# Patient Record
Sex: Female | Born: 1995 | Race: Black or African American | Hispanic: No | Marital: Single | Smoking: Never smoker
Health system: Southern US, Community
[De-identification: ages and names within clinical notes are randomized; demographics above are authoritative.]

## PROBLEM LIST (undated history)

## (undated) ENCOUNTER — Ambulatory Visit: Admission: EM | Payer: BC Managed Care – PPO | Source: Home / Self Care

## (undated) HISTORY — PX: ADENOIDECTOMY: SUR15

## (undated) HISTORY — PX: TONSILLECTOMY: SUR1361

---
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
## 2016-08-14 ENCOUNTER — Encounter (HOSPITAL_COMMUNITY): Payer: Self-pay

## 2016-08-14 ENCOUNTER — Emergency Department (HOSPITAL_COMMUNITY)
Admission: EM | Admit: 2016-08-14 | Discharge: 2016-08-14 | Disposition: A | Discharge status: Home / Self Care | Payer: BLUE CROSS/BLUE SHIELD | Attending: Emergency Medicine | Admitting: Emergency Medicine

## 2016-08-14 DIAGNOSIS — R21 Rash and other nonspecific skin eruption: Secondary | ICD-10-CM | POA: Diagnosis not present

## 2016-08-14 DIAGNOSIS — Z79899 Other long term (current) drug therapy: Secondary | ICD-10-CM | POA: Insufficient documentation

## 2016-08-14 MED ORDER — HYDROXYZINE HCL 25 MG PO TABS: 25.0000 mg | ORAL_TABLET | Freq: Four times a day (QID) | ORAL | 0 refills | Status: DC

## 2016-08-14 MED ORDER — HYDROXYZINE HCL 25 MG PO TABS
25.0000 mg | ORAL_TABLET | Freq: Once | ORAL | Status: AC
  Administered 2016-08-14: 25 mg via ORAL
  Filled 2016-08-14: qty 1

## 2016-08-14 MED ORDER — PREDNISONE 20 MG PO TABS
60.0000 mg | ORAL_TABLET | Freq: Once | ORAL | Status: AC
  Administered 2016-08-14: 60 mg via ORAL
  Filled 2016-08-14: qty 3

## 2016-08-14 MED ORDER — PREDNISONE 20 MG PO TABS: 40.0000 mg | ORAL_TABLET | Freq: Every day | ORAL | 0 refills | Status: DC

## 2016-08-14 MED ORDER — FAMOTIDINE 20 MG PO TABS: 20.0000 mg | ORAL_TABLET | Freq: Two times a day (BID) | ORAL | 0 refills | Status: DC

## 2016-08-14 MED ORDER — FAMOTIDINE 20 MG PO TABS
20.0000 mg | ORAL_TABLET | Freq: Once | ORAL | Status: AC
  Administered 2016-08-14: 20 mg via ORAL
  Filled 2016-08-14: qty 1

## 2016-08-14 NOTE — ED Provider Notes (Signed)
[facility] Provider Note   CSN: [id number] Arrival date & time: 08/14/16  1733     History   Chief Complaint Chief Complaint  Patient presents with  . Rash  . Allergic Reaction    HPI Traci Moore is a 21 y.o. female.  HPI Traci Moore is a 21 y.o. female presents to emergency department complaining of a rash. Patient states that she has had a rash to bilateral arms, wrists, hands, chest, neck, face for approximately a week. Rash started on her arms, since then progressed to the neck, and since yesterday she has had some rash over her face. She denies any new products, soaps, detergents, lotions. She states rash is very itchy. There is no drainage. She has tried Benadryl with no relief. No history of similar rash in the past. She reports the rash did start right after she wore a new T-shirt that she wanted a basketball game. She has not worn that T-shirt symptoms. Patient denies any new foods. Denies any swelling or tingling to her lips, tongue, throat. No difficulty breathing or swallowing. No rash over oral mucosa.  History reviewed. No pertinent past medical history.  There are no active problems to display for this patient.   Past Surgical History:  Procedure Laterality Date  . ADENOIDECTOMY    . TONSILLECTOMY      OB History    No data available       Home Medications    Prior to Admission medications   Medication Sig Start Date End Date Taking? Authorizing Provider  famotidine (PEPCID) 20 MG tablet Take 1 tablet (20 mg total) by mouth 2 (two) times daily. 08/14/16   Izacc Demeyer, PA-C  hydrOXYzine (ATARAX/VISTARIL) 25 MG tablet Take 1 tablet (25 mg total) by mouth every 6 (six) hours. 08/14/16   Jeriyah Granlund, PA-C  predniSONE (DELTASONE) 20 MG tablet Take 2 tablets (40 mg total) by mouth daily. 08/14/16   Jaynie Crumble, PA-C    Family History History reviewed. No pertinent family history.  Social History Social History  Substance  Use Topics  . Smoking status: Never Smoker  . Smokeless tobacco: Never Used  . Alcohol use Yes     Allergies   Patient has no allergy information on record.   Review of Systems Review of Systems  Constitutional: Negative for chills and fever.  Respiratory: Negative for cough, chest tightness and shortness of breath.   Cardiovascular: Negative for chest pain, palpitations and leg swelling.  Genitourinary: Negative for dysuria, flank pain and pelvic pain.  Musculoskeletal: Negative for arthralgias, myalgias, neck pain and neck stiffness.  Skin: Positive for rash.  Neurological: Negative for dizziness, weakness and headaches.  All other systems reviewed and are negative.    Physical Exam Updated Vital Signs BP (!) 97/53 (BP Location: Left Arm)   Pulse 71   Temp 98.6 F (37 C) (Oral)   Resp 18   LMP 07/14/2016   SpO2 98%   Physical Exam  Constitutional: She is oriented to person, place, and time. She appears well-developed and well-nourished. No distress.  HENT:  Head: Normocephalic and atraumatic.  Mouth/Throat: Oropharynx is clear and moist.  Normal lips and oral mucosa. No swelling of the tongue, throat, uvula. No rash.  Eyes: Conjunctivae are normal.  Neck: Normal range of motion. Neck supple.  Cardiovascular: Normal rate, regular rhythm and normal heart sounds.   Pulmonary/Chest: Effort normal and breath sounds normal. No respiratory distress. She has no wheezes. She has no rales.  Neurological: She is alert and oriented to person, place, and time.  Skin: Skin is warm and dry.  Erythematous, raised, papular rash to the dorsal hands, wrists, forearms, neck, peripheral face. No rash anywhere else on the body.  Nursing note and vitals reviewed.    ED Treatments / Results  Labs (all labs ordered are listed, but only abnormal results are displayed) Labs Reviewed - No data to display  EKG  EKG Interpretation None       Radiology No results  found.  Procedures Procedures (including critical care time)  Medications Ordered in ED Medications  predniSONE (DELTASONE) tablet 60 mg (60 mg Oral Given 08/14/16 2200)  hydrOXYzine (ATARAX/VISTARIL) tablet 25 mg (25 mg Oral Given 08/14/16 2200)  famotidine (PEPCID) tablet 20 mg (20 mg Oral Given 08/14/16 2200)     Initial Impression / Assessment and Plan / ED Course  I have reviewed the triage vital signs and the nursing notes.  Pertinent labs & imaging results that were available during my care of the patient were reviewed by me and considered in my medical decision making (see chart for details).     Patient with a rash, is consistent with contact dermatitis. Instructed to switch to hypoallergenic soaps and detergents. Will treat with prednisone, Vistaril, Pepcid. Instructed to follow-up with dermatology over primary care doctor. At this time there is no concern for [facility], no concern for angioedema, no respiratory issues. Rash does not appear to look like scabies, it does not involve the palms or soles. Return precautions discussed.  Vitals:   08/14/16 1806 08/14/16 1840 08/14/16 2204  BP: 105/78  (!) 97/53  Pulse: 90  71  Resp: 16  18  Temp:  98.6 F (37 C)   TempSrc:  Oral   SpO2: 98%  98%     Final Clinical Impressions(s) / ED Diagnoses   Final diagnoses:  Rash    New Prescriptions New Prescriptions   FAMOTIDINE (PEPCID) 20 MG TABLET    Take 1 tablet (20 mg total) by mouth 2 (two) times daily.   HYDROXYZINE (ATARAX/VISTARIL) 25 MG TABLET    Take 1 tablet (25 mg total) by mouth every 6 (six) hours.   PREDNISONE (DELTASONE) 20 MG TABLET    Take 2 tablets (40 mg total) by mouth daily.     Jaynie Crumbleatyana Adisa Litt, PA-C 08/14/16 2222    Jaynie Crumbleatyana Shaquile Lutze, PA-C 08/14/16 2223    Loren Raceravid Yelverton, MD 08/15/16 Lyda [id number]

## 2016-08-14 NOTE — ED Triage Notes (Signed)
Pt c/o itching, rash, and possible allergic reaction x 1 week.  Pt reports "bumps" on arms, neck, face, chest, and ears.  Sts she took 1 benadryl "the other day, but it didn't do any good."  Denies new lotions, creams, soaps, and detergents.

## 2016-08-14 NOTE — Discharge Instructions (Signed)
Avoid any scented or dyed products. Take prednisone as prescribed until all gone. Pepcid  as prescribed. Vistaril as prescribe for itching. Follow up with family doctor or dermatology if not improving.

## 2017-07-07 DIAGNOSIS — Z01419 Encounter for gynecological examination (general) (routine) without abnormal findings: Secondary | ICD-10-CM | POA: Diagnosis not present

## 2017-07-07 DIAGNOSIS — Z6841 Body Mass Index (BMI) 40.0 and over, adult: Secondary | ICD-10-CM | POA: Diagnosis not present

## 2017-07-07 DIAGNOSIS — Z113 Encounter for screening for infections with a predominantly sexual mode of transmission: Secondary | ICD-10-CM | POA: Diagnosis not present

## 2018-05-14 ENCOUNTER — Encounter (HOSPITAL_COMMUNITY): Payer: Self-pay

## 2018-05-14 ENCOUNTER — Ambulatory Visit (HOSPITAL_COMMUNITY)
Admission: EM | Admit: 2018-05-14 | Discharge: 2018-05-14 | Disposition: A | Discharge status: Home / Self Care | Payer: BLUE CROSS/BLUE SHIELD | Attending: Family Medicine | Admitting: Family Medicine

## 2018-05-14 DIAGNOSIS — J00 Acute nasopharyngitis [common cold]: Secondary | ICD-10-CM | POA: Diagnosis not present

## 2018-05-14 DIAGNOSIS — J040 Acute laryngitis: Secondary | ICD-10-CM

## 2018-05-14 MED ORDER — IPRATROPIUM BROMIDE 0.06 % NA SOLN: 2.0000 | Freq: Four times a day (QID) | NASAL | 0 refills | Status: DC

## 2018-05-14 MED ORDER — BENZONATATE 100 MG PO CAPS: 100.0000 mg | ORAL_CAPSULE | Freq: Three times a day (TID) | ORAL | 0 refills | Status: DC

## 2018-05-14 MED ORDER — FLUTICASONE PROPIONATE 50 MCG/ACT NA SUSP: 2.0000 | Freq: Every day | NASAL | 0 refills | Status: DC

## 2018-05-14 MED ORDER — PREDNISONE 50 MG PO TABS: 50.0000 mg | ORAL_TABLET | Freq: Every day | ORAL | 0 refills | Status: DC

## 2018-05-14 NOTE — ED Triage Notes (Signed)
Pt presents with upper respiratory symptoms; congestion, nasal drainage, persistent cough and headache.

## 2018-05-14 NOTE — Discharge Instructions (Addendum)
Tessalon for cough. Prednisone as directed for cough and hoarseness. Start flonase, atrovent nasal spray for nasal congestion/drainage. You can use over the counter nasal saline rinse such as neti pot for nasal congestion. Keep hydrated, your urine should be clear to pale yellow in color. Tylenol/motrin for fever and pain. Monitor for any worsening of symptoms, chest pain, shortness of breath, wheezing, swelling of the throat, follow up for reevaluation.   For sore throat/cough try using a honey-based tea. Use 3 teaspoons of honey with juice squeezed from half lemon. Place shaved pieces of ginger into 1/2-1 cup of water and warm over stove top. Then mix the ingredients and repeat every 4 hours as needed.

## 2018-05-14 NOTE — ED Provider Notes (Signed)
[facility]    CSN: [id number] Arrival date & time: 05/14/18  1506     History   Chief Complaint Chief Complaint  Patient presents with  . URI    HPI Traci Moore is a 22 y.o. female.   22 year old female comes in for 4-day history of URI symptoms.  Has had rhinorrhea, nasal congestion, cough, sinus pressure.  Denies fever, chills, night sweats.  Has sore throat, and states hoarseness has been worsening.  Has a history of asthma, but has not needed to use albuterol inhaler.  No obvious sick contact.  OTC cold medication without relief.     History reviewed. No pertinent past medical history.  There are no active problems to display for this patient.   Past Surgical History:  Procedure Laterality Date  . ADENOIDECTOMY    . TONSILLECTOMY      OB History   None      Home Medications    Prior to Admission medications   Medication Sig Start Date End Date Taking? Authorizing Provider  benzonatate (TESSALON) 100 MG capsule Take 1 capsule (100 mg total) by mouth every 8 (eight) hours. 05/14/18   Cathie Hoops, Emrick Hensch V, PA-C  famotidine (PEPCID) 20 MG tablet Take 1 tablet (20 mg total) by mouth 2 (two) times daily. 08/14/16   Kirichenko, Tatyana, PA-C  fluticasone (FLONASE) 50 MCG/ACT nasal spray Place 2 sprays into both nostrils daily. 05/14/18   Cathie Hoops, Ibeth Fahmy V, PA-C  hydrOXYzine (ATARAX/VISTARIL) 25 MG tablet Take 1 tablet (25 mg total) by mouth every 6 (six) hours. 08/14/16   Kirichenko, Tatyana, PA-C  ipratropium (ATROVENT) 0.06 % nasal spray Place 2 sprays into both nostrils 4 (four) times daily. 05/14/18   Cathie Hoops, Yalexa Blust V, PA-C  predniSONE (DELTASONE) 50 MG tablet Take 1 tablet (50 mg total) by mouth daily. 05/14/18   Belinda Fisher, PA-C    Family History History reviewed. No pertinent family history.  Social History Social History   Tobacco Use  . Smoking status: Never Smoker  . Smokeless tobacco: Never Used  Substance Use Topics  . Alcohol use: Yes  . Drug use: No      Allergies   Penicillins   Review of Systems Review of Systems  Reason unable to perform ROS: See HPI as above.     Physical Exam Triage Vital Signs ED Triage Vitals  Enc Vitals Group     BP 05/14/18 1551 119/80     Pulse Rate 05/14/18 1551 81     Resp 05/14/18 1551 20     Temp 05/14/18 1551 97.9 F (36.6 C)     Temp Source 05/14/18 1551 Oral     SpO2 05/14/18 1551 98 %     Weight --      Height --      Head Circumference --      Peak Flow --      Pain Score 05/14/18 1552 2     Pain Loc --      Pain Edu? --      Excl. in GC? --    No data found.  Updated Vital Signs BP 119/80 (BP Location: Left Arm)   Pulse 81   Temp 97.9 F (36.6 C) (Oral)   Resp 20   SpO2 98%   Physical Exam  Constitutional: She is oriented to person, place, and time. She appears well-developed and well-nourished. No distress.  HENT:  Head: Normocephalic and atraumatic.  Right Ear: Tympanic membrane, external ear and ear  canal normal. Tympanic membrane is not erythematous and not bulging.  Left Ear: Tympanic membrane, external ear and ear canal normal. Tympanic membrane is not erythematous and not bulging.  Nose: Rhinorrhea present. No mucosal edema. Right sinus exhibits no maxillary sinus tenderness and no frontal sinus tenderness. Left sinus exhibits no maxillary sinus tenderness and no frontal sinus tenderness.  Mouth/Throat: Uvula is midline, oropharynx is clear and moist and mucous membranes are normal.  Eyes: Pupils are equal, round, and reactive to light. Conjunctivae are normal.  Neck: Normal range of motion. Neck supple.  Cardiovascular: Normal rate, regular rhythm and normal heart sounds. Exam reveals no gallop and no friction rub.  No murmur heard. Pulmonary/Chest: Effort normal and breath sounds normal. She has no decreased breath sounds. She has no wheezes. She has no rhonchi. She has no rales.  Lymphadenopathy:    She has no cervical adenopathy.  Neurological: She is alert  and oriented to person, place, and time.  Skin: Skin is warm and dry.  Psychiatric: She has a normal mood and affect. Her behavior is normal. Judgment normal.     UC Treatments / Results  Labs (all labs ordered are listed, but only abnormal results are displayed) Labs Reviewed - No data to display  EKG None  Radiology No results found.  Procedures Procedures (including critical care time)  Medications Ordered in UC Medications - No data to display  Initial Impression / Assessment and Plan / UC Course  I have reviewed the triage vital signs and the nursing notes.  Pertinent labs & imaging results that were available during my care of the patient were reviewed by me and considered in my medical decision making (see chart for details).    Discussed with patient history and exam most consistent with viral URI. Symptomatic treatment as needed. Push fluids. Return precautions given.   Final Clinical Impressions(s) / UC Diagnoses   Final diagnoses:  Acute nasopharyngitis  Laryngitis    ED Prescriptions    Medication Sig Dispense Auth. Provider   fluticasone (FLONASE) 50 MCG/ACT nasal spray Place 2 sprays into both nostrils daily. 1 g Sloan Takagi V, PA-C   ipratropium (ATROVENT) 0.06 % nasal spray Place 2 sprays into both nostrils 4 (four) times daily. 15 mL Smt Lokey V, PA-C   benzonatate (TESSALON) 100 MG capsule Take 1 capsule (100 mg total) by mouth every 8 (eight) hours. 21 capsule Laasia Arcos V, PA-C   predniSONE (DELTASONE) 50 MG tablet Take 1 tablet (50 mg total) by mouth daily. 5 tablet Threasa Alpha, PA-C 05/14/18 1620

## 2020-07-15 ENCOUNTER — Other Ambulatory Visit: Payer: Self-pay

## 2020-07-15 DIAGNOSIS — Z20822 Contact with and (suspected) exposure to covid-19: Secondary | ICD-10-CM

## 2020-07-16 LAB — NOVEL CORONAVIRUS, NAA: SARS-CoV-2, NAA: NOT DETECTED

## 2020-07-16 LAB — SARS-COV-2, NAA 2 DAY TAT

## 2020-07-30 ENCOUNTER — Other Ambulatory Visit: Payer: 59

## 2020-08-01 ENCOUNTER — Other Ambulatory Visit: Payer: 59

## 2020-08-01 DIAGNOSIS — Z20822 Contact with and (suspected) exposure to covid-19: Secondary | ICD-10-CM

## 2020-08-05 LAB — NOVEL CORONAVIRUS, NAA: SARS-CoV-2, NAA: NOT DETECTED

## 2023-08-26 ENCOUNTER — Ambulatory Visit
Admission: EM | Admit: 2023-08-26 | Discharge: 2023-08-26 | Disposition: A | Discharge status: Home / Self Care | Payer: BC Managed Care – PPO | Attending: Family Medicine

## 2023-08-26 VITALS — BP 123/87 | HR 94 | Temp 102.3°F | Resp 16

## 2023-08-26 DIAGNOSIS — R509 Fever, unspecified: Secondary | ICD-10-CM

## 2023-08-26 DIAGNOSIS — J09X2 Influenza due to identified novel influenza A virus with other respiratory manifestations: Secondary | ICD-10-CM | POA: Diagnosis not present

## 2023-08-26 LAB — POC COVID19/FLU A&B COMBO
Covid Antigen, POC: NEGATIVE
Influenza A Antigen, POC: POSITIVE — AB
Influenza B Antigen, POC: NEGATIVE

## 2023-08-26 MED ORDER — OSELTAMIVIR PHOSPHATE 75 MG PO CAPS: 75.0000 mg | ORAL_CAPSULE | Freq: Two times a day (BID) | ORAL | 0 refills | Status: AC

## 2023-08-26 MED ORDER — ACETAMINOPHEN 325 MG PO TABS
975.0000 mg | ORAL_TABLET | Freq: Once | ORAL | Status: AC
  Administered 2023-08-26: 975 mg via ORAL

## 2023-08-26 NOTE — Discharge Instructions (Signed)
 Supportive Care Medications  These are medications that may help with your symptoms. However, Please note that if your PCP has told you not to use certain products please follow his/her recommendations.  For example: - if you have high BP you should use something similar to Coricidin HPB, not Sudafed or antihistamines with a "D" such as Allegra D. The "D" means decongestant.  (Afrin is safe to use with HBP, but do not use longer than 3 days) -Higher Doses of NSAIDS (Ibuprofen, Aleve etc) with Kidney disease or poorly controlled BP.  Fever, Body aches, Headache  Ibuprofen 200 mg 2 tablets and Acetaminophen 2 tabs 4 times a day just before meals and at bedtime (every 6 hours)    Do not use NSAIDS if you are allergic to NSAIDS, if you have been given oral steroids-prednisone, methylprednisolone, dexamethasone until your steroids are finished, if you are pregnant or breast feeding, or if you have history of kidney or liver disease.   Sore Throat: Cepacol throat lozenges or spray  Cough Drops Warm salt water gargles  How to make saltwater rinses Use warm water, because warmth is more relieving to a sore throat than cold water. Warm water will also help the salt dissolve into the water more effectively. Use any type of salt you have available. Most saltwater rinse recipes call for 8 ounces of warm water and 1 teaspoon of salt. However, if your mouth is tender and the saltwater rinse stings, decrease the salt to a 1/2 teaspoon for the first 1 to 2 days. Bring water to a boil, then remove from heat, add salt, and stir. Let the saltwater cool to a warm temperature before rinsing with it. Once you have finished your rinse, discard leftover solution to avoid contamination.  Nasal Congestion: Afrin (Oxymetazoline) 1 squirt in each side of nose 2 times daily for 3 days only.  Oral decongestants like sudafed but only if you do not have high Blood Pressure if you have high Blood Pressure take CORICIDIN  HBP Antihistamines Nasal Saline/Neti Pot  Cough: Expectorants (guaifenesin) help thin mucus making it easier to get up. This should be used during the day. During the day coughing helps bring mucus up out of your lungs Suppressants (dextromethorphan) just for bedtime so you can sleep   Oral rehydration is important when you have been sweating more than usual and/or having nausea and vomiting. You can use over the counter rehydration supplements like Pedialyte sport, Gatorlyte, Electrolit, Liquid IV, or you can make your own at home. Recipe:  Mix 1 liter of clean or boiled water with 6 teaspoons (2 tablespoons) of sugar and 1/2 tsp of salt. Stir until both dissolve.  You can add sugar free flavoring (i.e. Crystal light) if desired.  Drink small sips frequently rather than large amounts at once to prevent nausea.

## 2023-08-26 NOTE — ED Provider Notes (Signed)
 [facility]    CSN: [id number] Arrival date & time: 08/26/23  9141      History   Chief Complaint Chief Complaint  Patient presents with   Fever   Cough   Chills    HPI Traci Moore is a 28 y.o. female.   The history is provided by the patient.  Fever Associated symptoms: congestion, cough, diarrhea, headaches, myalgias, rhinorrhea and sore throat   Associated symptoms: no dysuria, no ear pain, no nausea and no rash   Cough Associated symptoms: fever, headaches, myalgias, rhinorrhea and sore throat   Associated symptoms: no ear pain, no rash, no shortness of breath and no wheezing   Feeling well for 2 days symptoms include fever, body aches, fatigue, rhinorrhea, nasal congestion, sore throat, cough.  Admits diarrhea.  Denies abdominal denies urinary symptoms she is a runner, broadcasting/film/video admits work contacts with illness.  No antipyretics taken today.  No significant past medical history.  Allergy to penicillin  History reviewed. No pertinent past medical history.  There are no active problems to display for this patient.   Past Surgical History:  Procedure Laterality Date   ADENOIDECTOMY     TONSILLECTOMY      OB History   No obstetric history on file.      Home Medications    Prior to Admission medications   Medication Sig Start Date End Date Taking? Authorizing Provider  benzonatate  (TESSALON ) 100 MG capsule Take 1 capsule (100 mg total) by mouth every 8 (eight) hours. 05/14/18   Babara, Amy V, PA-C  famotidine  (PEPCID ) 20 MG tablet Take 1 tablet (20 mg total) by mouth 2 (two) times daily. 08/14/16   Kirichenko, Tatyana, PA-C  fluticasone  (FLONASE ) 50 MCG/ACT nasal spray Place 2 sprays into both nostrils daily. 05/14/18   Babara, Amy V, PA-C  hydrOXYzine  (ATARAX /VISTARIL ) 25 MG tablet Take 1 tablet (25 mg total) by mouth every 6 (six) hours. 08/14/16   Kirichenko, Tatyana, PA-C  ipratropium (ATROVENT ) 0.06 % nasal spray Place 2 sprays into both nostrils 4 (four) times  daily. 05/14/18   Babara, Amy V, PA-C  predniSONE  (DELTASONE ) 50 MG tablet Take 1 tablet (50 mg total) by mouth daily. 05/14/18   Babara Greig GAILS, PA-C    Family History No family history on file.  Social History Social History   Tobacco Use   Smoking status: Never   Smokeless tobacco: Never  Substance Use Topics   Alcohol use: Yes   Drug use: No     Allergies   Penicillins   Review of Systems Review of Systems  Constitutional:  Positive for fever.  HENT:  Positive for congestion, rhinorrhea and sore throat. Negative for ear pain, trouble swallowing and voice change.   Respiratory:  Positive for cough. Negative for shortness of breath and wheezing.   Gastrointestinal:  Positive for diarrhea. Negative for abdominal pain and nausea.  Genitourinary:  Negative for dysuria.  Musculoskeletal:  Positive for myalgias.  Skin:  Negative for rash.  Neurological:  Positive for headaches.     Physical Exam Triage Vital Signs ED Triage Vitals [08/26/23 0918]  Encounter Vitals Group     BP 123/87     Systolic BP Percentile      Diastolic BP Percentile      Pulse Rate 94     Resp 16     Temp (!) 102.3 F (39.1 C)     Temp Source Oral     SpO2 94 %  Weight      Height      Head Circumference      Peak Flow      Pain Score      Pain Loc      Pain Education      Exclude from Growth Chart    No data found.  Updated Vital Signs BP 123/87 (BP Location: Left Arm)   Pulse 94   Temp (!) 102.3 F (39.1 C) (Oral)   Resp 16   SpO2 94%   Visual Acuity Right Eye Distance:   Left Eye Distance:   Bilateral Distance:    Right Eye Near:   Left Eye Near:    Bilateral Near:     Physical Exam Constitutional:      Appearance: Normal appearance. She is not ill-appearing.  HENT:     Head: Normocephalic.     Ears:     Comments: TM scarring otherwise normal, pearly gray normal landmarks no erythema or bulging    Nose: No rhinorrhea.     Mouth/Throat:     Mouth: Mucous membranes  are moist.     Pharynx: Oropharynx is clear. No oropharyngeal exudate or posterior oropharyngeal erythema.  Eyes:     Conjunctiva/sclera: Conjunctivae normal.  Cardiovascular:     Rate and Rhythm: Normal rate and regular rhythm.     Heart sounds: Normal heart sounds.  Pulmonary:     Effort: Pulmonary effort is normal. No respiratory distress.     Breath sounds: Normal breath sounds. No wheezing, rhonchi or rales.  Neurological:     Mental Status: She is alert and oriented to person, place, and time.  Psychiatric:        Mood and Affect: Mood normal.      UC Treatments / Results  Labs (all labs ordered are listed, but only abnormal results are displayed) Labs Reviewed  POC COVID19/FLU A&B COMBO    EKG   Radiology No results found.  Procedures Procedures (including critical care time)  Medications Ordered in UC Medications  acetaminophen  (TYLENOL ) tablet 975 mg (has no administration in time range)    Initial Impression / Assessment and Plan / UC Course  I have reviewed the triage vital signs and the nursing notes.  Pertinent labs & imaging results that were available during my care of the patient were reviewed by me and considered in my medical decision making (see chart for details).     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  She is nontoxic-appearing, well-hydrated.  Temperature 102.3 given acetaminophen .  Point-of-care positive for influenza A, COVID negative Final Clinical Impressions(s) / UC Diagnoses   Final diagnoses:  None   Discharge Instructions   None    ED Prescriptions   None    PDMP not reviewed this encounter.   Rodell Marrs, [place] 08/26/23 [phone]

## 2023-08-26 NOTE — ED Triage Notes (Signed)
 Cough and nasal congestion started 2 nights ago.  Fever and chills last night. No other symptoms to report.

## 2023-10-17 DIAGNOSIS — Z01419 Encounter for gynecological examination (general) (routine) without abnormal findings: Secondary | ICD-10-CM | POA: Diagnosis not present

## 2023-10-17 DIAGNOSIS — Z114 Encounter for screening for human immunodeficiency virus [HIV]: Secondary | ICD-10-CM | POA: Diagnosis not present

## 2023-10-17 DIAGNOSIS — Z113 Encounter for screening for infections with a predominantly sexual mode of transmission: Secondary | ICD-10-CM | POA: Diagnosis not present

## 2023-10-17 DIAGNOSIS — Z1159 Encounter for screening for other viral diseases: Secondary | ICD-10-CM | POA: Diagnosis not present

## 2023-10-19 DIAGNOSIS — R1900 Intra-abdominal and pelvic swelling, mass and lump, unspecified site: Secondary | ICD-10-CM | POA: Diagnosis not present

## 2023-10-19 DIAGNOSIS — Z049 Encounter for examination and observation for unspecified reason: Secondary | ICD-10-CM | POA: Diagnosis not present

## 2023-10-20 DIAGNOSIS — A599 Trichomoniasis, unspecified: Secondary | ICD-10-CM | POA: Diagnosis not present

## 2024-06-22 DIAGNOSIS — Z131 Encounter for screening for diabetes mellitus: Secondary | ICD-10-CM | POA: Diagnosis not present

## 2024-06-22 DIAGNOSIS — Z1329 Encounter for screening for other suspected endocrine disorder: Secondary | ICD-10-CM | POA: Diagnosis not present

## 2024-06-22 DIAGNOSIS — R635 Abnormal weight gain: Secondary | ICD-10-CM | POA: Diagnosis not present

## 2024-06-22 DIAGNOSIS — Z0000 Encounter for general adult medical examination without abnormal findings: Secondary | ICD-10-CM | POA: Diagnosis not present

## 2024-07-30 ENCOUNTER — Telehealth (INDEPENDENT_AMBULATORY_CARE_PROVIDER_SITE_OTHER): Payer: Self-pay | Admitting: Primary Care

## 2024-07-30 NOTE — Telephone Encounter (Signed)
 Spoke to pt about upcoming appt.. Will be present. Appt details will be sent in text message. Appt address is [address] [place], [address]

## 2024-08-07 ENCOUNTER — Ambulatory Visit (INDEPENDENT_AMBULATORY_CARE_PROVIDER_SITE_OTHER): Admitting: Primary Care

## 2024-08-09 ENCOUNTER — Telehealth (INDEPENDENT_AMBULATORY_CARE_PROVIDER_SITE_OTHER): Payer: Self-pay | Admitting: Primary Care

## 2024-08-09 NOTE — Telephone Encounter (Signed)
 Called pt to reschedule appt due to the provider not being in office that afternoon. Pt stated that she would call us  back to schedule appt.

## 2024-09-13 ENCOUNTER — Ambulatory Visit (INDEPENDENT_AMBULATORY_CARE_PROVIDER_SITE_OTHER): Admitting: Primary Care
